# Patient Record
Sex: Female | Born: 1980 | Race: White | Hispanic: No | Marital: Single | Smoking: Never smoker
Health system: Southern US, Community
[De-identification: ages and names within clinical notes are randomized; demographics above are authoritative.]

---

## 2015-04-27 ENCOUNTER — Emergency Department
Admission: EM | Admit: 2015-04-27 | Discharge: 2015-04-27 | Disposition: A | Discharge status: Home / Self Care | Payer: Self-pay | Attending: Emergency Medicine | Admitting: Emergency Medicine

## 2015-04-27 ENCOUNTER — Encounter: Payer: Self-pay | Admitting: Emergency Medicine

## 2015-04-27 DIAGNOSIS — Y999 Unspecified external cause status: Secondary | ICD-10-CM | POA: Insufficient documentation

## 2015-04-27 DIAGNOSIS — Y939 Activity, unspecified: Secondary | ICD-10-CM | POA: Insufficient documentation

## 2015-04-27 DIAGNOSIS — Y92410 Unspecified street and highway as the place of occurrence of the external cause: Secondary | ICD-10-CM | POA: Insufficient documentation

## 2015-04-27 DIAGNOSIS — S161XXA Strain of muscle, fascia and tendon at neck level, initial encounter: Secondary | ICD-10-CM

## 2015-04-27 DIAGNOSIS — S39012A Strain of muscle, fascia and tendon of lower back, initial encounter: Secondary | ICD-10-CM

## 2015-04-27 MED ORDER — NAPROXEN 500 MG PO TBEC: 500.0000 mg | DELAYED_RELEASE_TABLET | Freq: Two times a day (BID) | ORAL | Status: AC

## 2015-04-27 MED ORDER — CYCLOBENZAPRINE HCL 5 MG PO TABS: 5.0000 mg | ORAL_TABLET | Freq: Three times a day (TID) | ORAL | Status: AC | PRN
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
## 2015-05-01 ENCOUNTER — Emergency Department: Payer: No Typology Code available for payment source

## 2015-05-01 ENCOUNTER — Emergency Department
Admission: EM | Admit: 2015-05-01 | Discharge: 2015-05-02 | Disposition: A | Discharge status: Home / Self Care | Payer: No Typology Code available for payment source | Attending: Emergency Medicine | Admitting: Emergency Medicine

## 2015-05-01 DIAGNOSIS — M6240 Contracture of muscle, unspecified site: Secondary | ICD-10-CM | POA: Diagnosis not present

## 2015-05-01 DIAGNOSIS — M545 Low back pain: Secondary | ICD-10-CM | POA: Diagnosis present

## 2015-05-01 DIAGNOSIS — M25519 Pain in unspecified shoulder: Secondary | ICD-10-CM | POA: Diagnosis not present

## 2015-05-01 DIAGNOSIS — S39012D Strain of muscle, fascia and tendon of lower back, subsequent encounter: Secondary | ICD-10-CM | POA: Insufficient documentation

## 2015-05-01 DIAGNOSIS — M542 Cervicalgia: Secondary | ICD-10-CM | POA: Insufficient documentation

## 2015-05-01 DIAGNOSIS — M62838 Other muscle spasm: Secondary | ICD-10-CM
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
## 2015-05-02 MED ORDER — METHOCARBAMOL 500 MG PO TABS: 500.0000 mg | ORAL_TABLET | Freq: Four times a day (QID) | ORAL | Status: AC

## 2015-05-02 MED ORDER — MELOXICAM 15 MG PO TABS: 15.0000 mg | ORAL_TABLET | Freq: Every day | ORAL | Status: AC

## 2015-05-02 NOTE — Discharge Instructions (Signed)
Muscle Strain  A muscle strain is an injury that occurs when a muscle is stretched beyond its normal length. Usually a small number of muscle fibers are torn when this happens. Muscle strain is rated in degrees. First-degree strains have the least amount of muscle fiber tearing and pain. Second-degree and third-degree strains have increasingly more tearing and pain.   Usually, recovery from muscle strain takes 1-2 weeks. Complete healing takes 5-6 weeks.   CAUSES   Muscle strain happens when a sudden, violent force placed on a muscle stretches it too far. This may occur with lifting, sports, or a fall.   RISK FACTORS  Muscle strain is especially common in athletes.   SIGNS AND SYMPTOMS  At the site of the muscle strain, there may be:   Pain.   Bruising.   Swelling.   Difficulty using the muscle due to pain or lack of normal function.  DIAGNOSIS   Your health care provider will perform a physical exam and ask about your medical history.  TREATMENT   Often, the best treatment for a muscle strain is resting, icing, and applying cold compresses to the injured area.   HOME CARE INSTRUCTIONS    Use the PRICE method of treatment to promote muscle healing during the first 2-3 days after your injury. The PRICE method involves:    Protecting the muscle from being injured again.    Restricting your activity and resting the injured body part.    Icing your injury. To do this, put ice in a plastic bag. Place a towel between your skin and the bag. Then, apply the ice and leave it on from 15-20 minutes each hour. After the third day, switch to moist heat packs.    Apply compression to the injured area with a splint or elastic bandage. Be careful not to wrap it too tightly. This may interfere with blood circulation or increase swelling.    Elevate the injured body part above the level of your heart as often as you can.   Only take over-the-counter or prescription medicines for pain, discomfort, or fever as directed by your  health care provider.   Warming up prior to exercise helps to prevent future muscle strains.  SEEK MEDICAL CARE IF:    You have increasing pain or swelling in the injured area.   You have numbness, tingling, or a significant loss of strength in the injured area.  MAKE SURE YOU:    Understand these instructions.   Will watch your condition.   Will get help right away if you are not doing well or get worse.     This information is not intended to replace advice given to you by your health care provider. Make sure you discuss any questions you have with your health care provider.     Document Released: 01/10/2005 Document Revised: 10/31/2012 Document Reviewed: 08/09/2012  Elsevier Interactive Patient Education 2016 Elsevier Inc.  Motor Vehicle Collision  It is common to have multiple bruises and sore muscles after a motor vehicle collision (MVC). These tend to feel worse for the first 24 hours. You may have the most stiffness and soreness over the first several hours. You may also feel worse when you wake up the first morning after your collision. After this point, you will usually begin to improve with each day. The speed of improvement often depends on the severity of the collision, the number of injuries, and the location and nature of these injuries.  HOME CARE INSTRUCTIONS     Put ice on the injured area.    Put ice in a plastic bag.    Place a towel between your skin and the bag.    Leave the ice on for 15-20 minutes, 3-4 times a day, or as directed by your health care provider.   Drink enough fluids to keep your urine clear or pale yellow. Do not drink alcohol.   Take a warm shower or bath once or twice a day. This will increase blood flow to sore muscles.   You may return to activities as directed by your caregiver. Be careful when lifting, as this may aggravate neck or back pain.   Only take over-the-counter or prescription medicines for pain, discomfort, or fever as directed by your caregiver. Do  not use aspirin. This may increase bruising and bleeding.  SEEK IMMEDIATE MEDICAL CARE IF:   You have numbness, tingling, or weakness in the arms or legs.   You develop severe headaches not relieved with medicine.   You have severe neck pain, especially tenderness in the middle of the back of your neck.   You have changes in bowel or bladder control.   There is increasing pain in any area of the body.   You have shortness of breath, light-headedness, dizziness, or fainting.   You have chest pain.   You feel sick to your stomach (nauseous), throw up (vomit), or sweat.   You have increasing abdominal discomfort.   There is blood in your urine, stool, or vomit.   You have pain in your shoulder (shoulder strap areas).   You feel your symptoms are getting worse.  MAKE SURE YOU:   Understand these instructions.   Will watch your condition.   Will get help right away if you are not doing well or get worse.     This information is not intended to replace advice given to you by your health care provider. Make sure you discuss any questions you have with your health care provider.     Document Released: 01/10/2005 Document Revised: 01/31/2014 Document Reviewed: 06/09/2010  Elsevier Interactive Patient Education 2016 Elsevier Inc.
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
## 2015-05-02 NOTE — ED Notes (Signed)
Pt alert and oriented X4, active, cooperative, pt in NAD. RR even and unlabored, color WNL.  Pt informed to return if any life threatening symptoms occur.   

## 2016-02-21 ENCOUNTER — Emergency Department
Admission: EM | Admit: 2016-02-21 | Discharge: 2016-02-21 | Disposition: A | Discharge status: Home / Self Care | Payer: No Typology Code available for payment source | Attending: Emergency Medicine | Admitting: Emergency Medicine

## 2016-02-21 ENCOUNTER — Emergency Department: Payer: No Typology Code available for payment source

## 2016-02-21 ENCOUNTER — Encounter: Payer: Self-pay | Admitting: Emergency Medicine

## 2016-02-21 DIAGNOSIS — Y9389 Activity, other specified: Secondary | ICD-10-CM | POA: Diagnosis not present

## 2016-02-21 DIAGNOSIS — S298XXA Other specified injuries of thorax, initial encounter: Secondary | ICD-10-CM

## 2016-02-21 DIAGNOSIS — S39012A Strain of muscle, fascia and tendon of lower back, initial encounter: Secondary | ICD-10-CM | POA: Diagnosis not present

## 2016-02-21 DIAGNOSIS — Y92410 Unspecified street and highway as the place of occurrence of the external cause: Secondary | ICD-10-CM | POA: Insufficient documentation

## 2016-02-21 DIAGNOSIS — S20211A Contusion of right front wall of thorax, initial encounter: Secondary | ICD-10-CM | POA: Diagnosis not present

## 2016-02-21 DIAGNOSIS — S3992XA Unspecified injury of lower back, initial encounter: Secondary | ICD-10-CM | POA: Diagnosis present

## 2016-02-21 DIAGNOSIS — Y999 Unspecified external cause status: Secondary | ICD-10-CM | POA: Diagnosis not present

## 2016-02-21 DIAGNOSIS — Z79899 Other long term (current) drug therapy: Secondary | ICD-10-CM | POA: Diagnosis not present

## 2016-02-21 LAB — POCT PREGNANCY, URINE: Preg Test, Ur: NEGATIVE

## 2016-02-21 MED ORDER — ORPHENADRINE CITRATE 30 MG/ML IJ SOLN
60.0000 mg | Freq: Two times a day (BID) | INTRAMUSCULAR | Status: DC
  Administered 2016-02-21: 60 mg via INTRAMUSCULAR
  Filled 2016-02-21: qty 2

## 2016-02-21 MED ORDER — KETOROLAC TROMETHAMINE 60 MG/2ML IM SOLN
60.0000 mg | Freq: Once | INTRAMUSCULAR | Status: AC
  Administered 2016-02-21: 60 mg via INTRAMUSCULAR
  Filled 2016-02-21: qty 2

## 2016-02-21 MED ORDER — TRAMADOL HCL 50 MG PO TABS: 50.0000 mg | ORAL_TABLET | Freq: Four times a day (QID) | ORAL | 0 refills | Status: AC | PRN

## 2016-02-21 MED ORDER — CYCLOBENZAPRINE HCL 10 MG PO TABS: 10.0000 mg | ORAL_TABLET | Freq: Three times a day (TID) | ORAL | 0 refills | Status: AC | PRN
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
## 2016-04-11 ENCOUNTER — Emergency Department
Admission: EM | Admit: 2016-04-11 | Discharge: 2016-04-11 | Disposition: A | Discharge status: Home / Self Care | Payer: Self-pay | Attending: Emergency Medicine | Admitting: Emergency Medicine

## 2016-04-11 DIAGNOSIS — L245 Irritant contact dermatitis due to other chemical products: Secondary | ICD-10-CM | POA: Insufficient documentation

## 2016-04-11 MED ORDER — PREDNISONE 10 MG (21) PO TBPK: ORAL_TABLET | Freq: Every day | ORAL | 0 refills | Status: AC

## 2016-04-11 NOTE — ED Notes (Signed)
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  This has happened in the past 2 weeks as well.  Pt reports that she has been doing this job for 1 year and has never experienced this prior to the past 2 weeks.

## 2016-04-11 NOTE — ED Notes (Signed)
Pt discharged to home.  Family member driving.  Discharge instructions reviewed.  Verbalized understanding.  No questions or concerns at this time.  Teach back verified.  Pt in NAD.  No items left in ED.   

## 2016-04-11 NOTE — ED Triage Notes (Signed)
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 Noted hands started getting dry, red patches noted around eyes.

## 2016-04-12 NOTE — ED Provider Notes (Signed)
[facility] Emergency Department Provider Note  ____________________________________________  Time seen: Approximately 11:42 PM  I have reviewed the triage vital signs and the nursing notes.   HISTORY  Chief Complaint Rash    HPI Carol Hill is a 36 y.o. female presenting to the emergency department with a rash of the skin overlying the left eye that has occurred since patient started using a respiratory mask at work. Patient states that she has been working in an extremely dry environment and has contact with numerous chemicals. Patient states that rash has occurred intermittently for the past year but has acutely worsened over the past 2 weeks. Patient has tried topical hydrocortisone and Vaseline but no other alleviating measures.   No past medical history on file.  There are no active problems to display for this patient.   No past surgical history on file.  Prior to Admission medications   Medication Sig Start Date End Date Taking? Authorizing Provider  cyclobenzaprine (FLEXERIL) 10 MG tablet Take 1 tablet (10 mg total) by mouth 3 (three) times daily as needed. 02/21/16   Joni Reining, PA-C  cyclobenzaprine (FLEXERIL) 5 MG tablet Take 1 tablet (5 mg total) by mouth every 8 (eight) hours as needed for muscle spasms. 04/27/15   Jenise V Bacon Menshew, PA-C  meloxicam (MOBIC) 15 MG tablet Take 1 tablet (15 mg total) by mouth daily. 05/02/15   Delorise Royals Cuthriell, PA-C  methocarbamol (ROBAXIN) 500 MG tablet Take 1 tablet (500 mg total) by mouth 4 (four) times daily. 05/02/15   Delorise Royals Cuthriell, PA-C  naproxen (EC NAPROSYN) 500 MG EC tablet Take 1 tablet (500 mg total) by mouth 2 (two) times daily with a meal. 04/27/15   Jenise V Bacon Menshew, PA-C  predniSONE (STERAPRED UNI-PAK 21 TAB) 10 MG (21) TBPK tablet Take by mouth daily. Take 6 tablets the first day, take 5 tablets the second day, take 4 tablets the third day, take 3 tablets the fourth day, take  2 tablets the fifth day, take 1 tablet the sixth day. 04/11/16 04/17/16  Orvil Feil, PA-C  traMADol (ULTRAM) 50 MG tablet Take 1 tablet (50 mg total) by mouth every 6 (six) hours as needed for moderate pain. 02/21/16   Joni Reining, PA-C    Allergies Patient has no known allergies.  No family history on file.  Social History Social History  Substance Use Topics  . Smoking status: Never Smoker  . Smokeless tobacco: Never Used  . Alcohol use No    Review of Systems  Constitutional: No fever/chills Eyes: No visual changes. No discharge ENT: No upper respiratory complaints. Cardiovascular: no chest pain. Respiratory: no cough. No SOB. Gastrointestinal: No abdominal pain.  No nausea, no vomiting.  No diarrhea.  No constipation. Musculoskeletal: Negative for musculoskeletal pain. Skin: Patient has rash.  Neurological: Negative for headaches, focal weakness or numbness. ____________________________________________   PHYSICAL EXAM:  VITAL SIGNS: ED Triage Vitals  Enc Vitals Group     BP 04/11/16 2104 (!) 130/111     Pulse Rate 04/11/16 2105 73     Resp 04/11/16 2104 18     Temp 04/11/16 2104 98.7 F (37.1 C)     Temp Source 04/11/16 2104 Oral     SpO2 04/11/16 2104 99 %     Weight 04/11/16 2104 135 lb (61.2 kg)     Height 04/11/16 2104 5\' 6"  (1.676 m)     Head Circumference --  Peak Flow --      Pain Score --      Pain Loc --      Pain Edu? --      Excl. in GC? --      Constitutional: Alert and oriented. Well appearing and in no acute distress. Eyes: Conjunctivae are normal. PERRL. EOMI. Head: Atraumatic. Cardiovascular: Normal rate, regular rhythm. Normal S1 and S2.  Good peripheral circulation. Respiratory: Normal respiratory effort without tachypnea or retractions. Lungs CTAB. Good air entry to the bases with no decreased or absent breath sounds. Musculoskeletal: Full range of motion to all extremities. No gross deformities appreciated. Neurologic:  Normal  speech and language. No gross focal neurologic deficits are appreciated.  Skin:  Patient has dry macular rash of the skin overlying the left eye. No surrounding cellulitis or streaking visualized. Psychiatric: Mood and affect are normal. Speech and behavior are normal. Patient exhibits appropriate insight and judgement. ____________________________________________   LABS (all labs ordered are listed, but only abnormal results are displayed)  Labs Reviewed - No data to display ____________________________________________  EKG   ____________________________________________  RADIOLOGY   No results found.  ____________________________________________    PROCEDURES  Procedure(s) performed:    Procedures    Medications - No data to display   ____________________________________________   INITIAL IMPRESSION / ASSESSMENT AND PLAN / ED COURSE  Pertinent labs & imaging results that were available during my care of the patient were reviewed by me and considered in my medical decision making (see chart for details).  Review of the [place] CSRS was performed in accordance of the [facility] prior to dispensing any controlled drugs.     Assessment and plan: Contact Dermatitis:  Patient presents to the emergency department with a dry macular rash of the skin overlying the left eye. Patient has noticed rash since she has started using a respiratory mask at work. Physical exam findings are consistent with contact dermatitis. Patient was advised to use petroleum jelly before application of respiratory mask to provide a barrier of moisture during times of excessive dryness. Patient was also discharged with tapered prednisone. Vital signs are reassuring at this time. All patient questions were answered.  ____________________________________________  FINAL CLINICAL IMPRESSION(S) / ED DIAGNOSES  Final diagnoses:  Irritant contact dermatitis due to other chemical products      NEW  MEDICATIONS STARTED DURING THIS VISIT:  Discharge Medication List as of 04/11/2016 11:05 PM    START taking these medications   Details  predniSONE (STERAPRED UNI-PAK 21 TAB) 10 MG (21) TBPK tablet Take by mouth daily. Take 6 tablets the first day, take 5 tablets the second day, take 4 tablets the third day, take 3 tablets the fourth day, take 2 tablets the fifth day, take 1 tablet the sixth day., Starting Mon 04/11/2016, Until Sun 04/17/2016, Print            This chart was dictated using voice recognition software/Dragon. Despite best efforts to proofread, errors can occur which can change the meaning. Any change was purely unintentional.    Orvil FeilJaclyn M Keyston Ardolino, PA-C 04/12/16 2348    Myrna Blazeravid Matthew Schaevitz, MD 04/15/16 [phone]

## 2016-04-25 ENCOUNTER — Encounter: Payer: Self-pay | Admitting: *Deleted

## 2016-04-25 ENCOUNTER — Emergency Department
Admission: EM | Admit: 2016-04-25 | Discharge: 2016-04-25 | Disposition: A | Discharge status: Home / Self Care | Payer: Self-pay | Attending: Emergency Medicine | Admitting: Emergency Medicine

## 2016-04-25 DIAGNOSIS — L245 Irritant contact dermatitis due to other chemical products: Secondary | ICD-10-CM | POA: Insufficient documentation

## 2016-04-25 MED ORDER — FLUOCINOLONE ACETONIDE 0.01 % EX CREA: TOPICAL_CREAM | Freq: Two times a day (BID) | CUTANEOUS | 0 refills | Status: AC

## 2016-04-25 NOTE — ED Triage Notes (Signed)
States she has burning and itching on her face and neck, states the same thing happened several weeks ago and she was given predisone and states it cleared up but not it is back, redness noted around both eyes

## 2016-04-25 NOTE — ED Provider Notes (Signed)
[facility] Emergency Department Provider Note  ____________________________________________  Time seen: Approximately 8:07 PM  I have reviewed the triage vital signs and the nursing notes.   HISTORY  Chief Complaint Rash   HPI Carol Hill is a 36 y.o. female presenting to the emergency department with an exacerbation of chemical induced contact dermatitis affecting the periorbital skin bilaterally. Patient presented to the emergency department and was seen by myself, Pia Mau PA-C, on03/19/2018. 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 At aforementioned emergency department visit, patient was given a work note and discharged with tapered prednisone. Patient states that rash overlying her eyes almost completely resolved. Once patient returned to work and prednisone was completed, rash returned. Patient denies chest pain, chest tightness, shortness of breath, nausea, vomiting and abdominal pain. Patient states that she would like to be referred to an allergist. Patient has noticed no dysphagia and is managing her own secretions.   History reviewed. No pertinent past medical history.  There are no active problems to display for this patient.   History reviewed. No pertinent surgical history.  Prior to Admission medications   Medication Sig Start Date End Date Taking? Authorizing Provider  cyclobenzaprine (FLEXERIL) 10 MG tablet Take 1 tablet (10 mg total) by mouth 3 (three) times daily as needed. 02/21/16   Joni Reining, PA-C  cyclobenzaprine (FLEXERIL) 5 MG tablet Take 1 tablet (5 mg total) by mouth every 8 (eight) hours as needed for muscle spasms. 04/27/15   Jenise V Bacon Menshew, PA-C  fluocinolone (VANOS) 0.01 % cream Apply topically 2 (two) times daily. 04/25/16   Orvil Feil, PA-C  meloxicam (MOBIC) 15 MG tablet Take 1 tablet (15 mg total) by mouth daily. 05/02/15   Delorise Royals Cuthriell, PA-C  methocarbamol  (ROBAXIN) 500 MG tablet Take 1 tablet (500 mg total) by mouth 4 (four) times daily. 05/02/15   Delorise Royals Cuthriell, PA-C  naproxen (EC NAPROSYN) 500 MG EC tablet Take 1 tablet (500 mg total) by mouth 2 (two) times daily with a meal. 04/27/15   Jenise V Bacon Menshew, PA-C  traMADol (ULTRAM) 50 MG tablet Take 1 tablet (50 mg total) by mouth every 6 (six) hours as needed for moderate pain. 02/21/16   Joni Reining, PA-C    Allergies Patient has no known allergies.  History reviewed. No pertinent family history.  Social History Social History  Substance Use Topics  . Smoking status: Never Smoker  . Smokeless tobacco: Never Used  . Alcohol use No     Review of Systems  Constitutional: No fever/chills Eyes: No visual changes. No discharge ENT: No upper respiratory complaints. Cardiovascular: no chest pain. Respiratory: no cough. No SOB. Gastrointestinal: No abdominal pain.  No nausea, no vomiting.  Musculoskeletal: Negative for musculoskeletal pain. Skin: She has rash Neurological: Negative for headaches, focal weakness or numbness.  ____________________________________________   PHYSICAL EXAM:  VITAL SIGNS: ED Triage Vitals  Enc Vitals Group     BP 04/25/16 1609 131/74     Pulse Rate 04/25/16 1609 66     Resp 04/25/16 1609 18     Temp 04/25/16 1609 98.4 F (36.9 C)     Temp Source 04/25/16 1609 Oral     SpO2 04/25/16 1609 100 %     Weight 04/25/16 1607 140 lb (63.5 kg)     Height 04/25/16 1607  (1.676 m)     Head Circumference --      Peak  Flow --      Pain Score 04/25/16 1607 3     Pain Loc --      Pain Edu? --      Excl. in GC? --     Constitutional: Alert and oriented. Well appearing and in no acute distress. Eyes: Conjunctivae are normal. PERRL. EOMI. Head: Atraumatic. Cardiovascular: Normal rate, regular rhythm. Normal S1 and S2.  Good peripheral circulation. Respiratory: Normal respiratory effort without tachypnea or retractions. Lungs CTAB. Good air  entry to the bases with no decreased or absent breath sounds. Musculoskeletal: Full range of motion to all extremities. No gross deformities appreciated. Neurologic:  Normal speech and language. No gross focal neurologic deficits are appreciated.  Skin: Patient has dry macular rash of the skin overlying the bilateral periorbital space. Psychiatric: Mood and affect are normal. Speech and behavior are normal. Patient exhibits appropriate insight and judgement. ____________________________________________   LABS (all labs ordered are listed, but only abnormal results are displayed)  Labs Reviewed - No data to display ____________________________________________  EKG   ____________________________________________  RADIOLOGY   No results found.  ____________________________________________    PROCEDURES  Procedure(s) performed:    Procedures    Medications - No data to display   ____________________________________________   INITIAL IMPRESSION / ASSESSMENT AND PLAN / ED COURSE  Pertinent labs & imaging results that were available during my care of the patient were reviewed by me and considered in my medical decision making (see chart for details).  Review of the [place] CSRS was performed in accordance of the [facility] prior to dispensing any controlled drugs.     Assessment and plan Chemical Induced Contact Dermatitis:  Patient presents to the emergency department with a dry macular rash consistent with prior diagnoses of chemical-induced contact dermatitis. Patient education was provided regarding the importance of allergen and avoidance. Patient voiced understanding. Patient and I discussed the possibility of finding other means of work in order to permanently resolve contact dermatitis. Patient has tried hydrocortisone and tapered prednisone with limited success. Patient was discharged with fluocinolone cream. A referral was made to a local allergists at patient's request.  Vital signs and physical exam are reassuring. All patient questions were answered.  ____________________________________________  FINAL CLINICAL IMPRESSION(S) / ED DIAGNOSES  Final diagnoses:  Irritant contact dermatitis due to other chemical products      NEW MEDICATIONS STARTED DURING THIS VISIT:  Discharge Medication List as of 04/25/2016  6:49 PM    START taking these medications   Details  fluocinolone (VANOS) 0.01 % cream Apply topically 2 (two) times daily., Starting Mon 04/25/2016, Print            This chart was dictated using voice recognition software/Dragon. Despite best efforts to proofread, errors can occur which can change the meaning. Any change was purely unintentional.    Orvil Feil, PA-C 04/25/16 2307    Sharyn Creamer, MD 04/25/16 2350

## 2016-04-25 NOTE — ED Notes (Signed)
See triage note   States she was seen for same in mid march  Placed on prednisone taper which she finished on 3/25  States sx's had gotten better  But returned again this weekend   Redness and swelling noted around both eyes

## 2017-01-06 ENCOUNTER — Emergency Department
Admission: EM | Admit: 2017-01-06 | Discharge: 2017-01-06 | Disposition: A | Discharge status: Home / Self Care | Payer: Self-pay | Attending: Emergency Medicine | Admitting: Emergency Medicine

## 2017-01-06 ENCOUNTER — Other Ambulatory Visit: Payer: Self-pay

## 2017-01-06 DIAGNOSIS — B373 Candidiasis of vulva and vagina: Secondary | ICD-10-CM | POA: Insufficient documentation

## 2017-01-06 DIAGNOSIS — N760 Acute vaginitis: Secondary | ICD-10-CM | POA: Insufficient documentation

## 2017-01-06 DIAGNOSIS — B9689 Other specified bacterial agents as the cause of diseases classified elsewhere: Secondary | ICD-10-CM

## 2017-01-06 DIAGNOSIS — Z791 Long term (current) use of non-steroidal anti-inflammatories (NSAID): Secondary | ICD-10-CM | POA: Insufficient documentation

## 2017-01-06 DIAGNOSIS — Z79899 Other long term (current) drug therapy: Secondary | ICD-10-CM | POA: Insufficient documentation

## 2017-01-06 DIAGNOSIS — B3731 Acute candidiasis of vulva and vagina: Secondary | ICD-10-CM

## 2017-01-06 LAB — POCT PREGNANCY, URINE: PREG TEST UR: NEGATIVE

## 2017-01-06 LAB — URINALYSIS, ROUTINE W REFLEX MICROSCOPIC
BILIRUBIN URINE: NEGATIVE
Glucose, UA: NEGATIVE mg/dL
Hgb urine dipstick: NEGATIVE
KETONES UR: NEGATIVE mg/dL
NITRITE: NEGATIVE
PROTEIN: NEGATIVE mg/dL
Specific Gravity, Urine: 1.004 — ABNORMAL LOW (ref 1.005–1.030)
pH: 5 (ref 5.0–8.0)

## 2017-01-06 LAB — WET PREP, GENITAL
Sperm: NONE SEEN
Trich, Wet Prep: NONE SEEN

## 2017-01-06 LAB — CHLAMYDIA/NGC RT PCR (ARMC ONLY)
Chlamydia Tr: NOT DETECTED
N GONORRHOEAE: NOT DETECTED

## 2017-01-06 MED ORDER — FLUCONAZOLE 50 MG PO TABS
150.0000 mg | ORAL_TABLET | Freq: Once | ORAL | Status: AC
  Administered 2017-01-06: 150 mg via ORAL
  Filled 2017-01-06: qty 3

## 2017-01-06 MED ORDER — METRONIDAZOLE 500 MG PO TABS: 500.0000 mg | ORAL_TABLET | Freq: Two times a day (BID) | ORAL | 0 refills | Status: AC

## 2017-01-06 MED ORDER — NYSTATIN 100000 UNIT/GM EX POWD: Freq: Four times a day (QID) | CUTANEOUS | 0 refills | Status: AC

## 2017-01-06 NOTE — ED Triage Notes (Signed)
Pt reports approximately one week of vaginal discharge that is clear and white. Pt reports odor to the discharge. Pt also reports she developed a rash but it seems to be going away.

## 2017-01-06 NOTE — ED Provider Notes (Signed)
[facility] Emergency Department Provider Note  Time seen: 9:22 PM  I have reviewed the triage vital signs and the nursing notes.   HISTORY  Chief Complaint Vaginal Discharge    HPI Carol Hill is a 36 y.o. female with no significant past medical history presents to the emergency department for vaginal discharge.  According to the patient for the past 1 week she has noticed foul-smelling white vaginal discharge.  Patient is here with her husband, denies significant STD risk.  Denies fever, states she has been somewhat nauseated and several days ago had an episode of vomiting.  Denies diarrhea.  Denies dysuria.   No past medical history on file.  There are no active problems to display for this patient.   No past surgical history on file.  Prior to Admission medications   Medication Sig Start Date End Date Taking? Authorizing Provider  cyclobenzaprine (FLEXERIL) 10 MG tablet Take 1 tablet (10 mg total) by mouth 3 (three) times daily as needed. 02/21/16   Joni ReiningSmith, Ronald K, PA-C  cyclobenzaprine (FLEXERIL) 5 MG tablet Take 1 tablet (5 mg total) by mouth every 8 (eight) hours as needed for muscle spasms. 04/27/15   Menshew, Charlesetta IvoryJenise V Bacon, PA-C  fluocinolone (VANOS) 0.01 % cream Apply topically 2 (two) times daily. 04/25/16   Orvil FeilWoods, Jaclyn M, PA-C  meloxicam (MOBIC) 15 MG tablet Take 1 tablet (15 mg total) by mouth daily. 05/02/15   Cuthriell, Delorise RoyalsJonathan D, PA-C  methocarbamol (ROBAXIN) 500 MG tablet Take 1 tablet (500 mg total) by mouth 4 (four) times daily. 05/02/15   Cuthriell, Delorise RoyalsJonathan D, PA-C  naproxen (EC NAPROSYN) 500 MG EC tablet Take 1 tablet (500 mg total) by mouth 2 (two) times daily with a meal. 04/27/15   Menshew, Charlesetta IvoryJenise V Bacon, PA-C  traMADol (ULTRAM) 50 MG tablet Take 1 tablet (50 mg total) by mouth every 6 (six) hours as needed for moderate pain. 02/21/16   Joni ReiningSmith, Ronald K, PA-C    No Known Allergies  No family history on file.  Social  History Social History   Tobacco Use  . Smoking status: Never Smoker  . Smokeless tobacco: Never Used  Substance Use Topics  . Alcohol use: No  . Drug use: No    Review of Systems Constitutional: Negative for fever Cardiovascular: Negative for chest pain. Respiratory: Negative for shortness of breath. Gastrointestinal: Mild lower abdominal discomfort.  Occasional nausea.  No diarrhea. Genitourinary: Negative for dysuria.  Positive for vaginal discharge times 1 week. Neurological: Negative for headache All other ROS negative  ____________________________________________   PHYSICAL EXAM:  VITAL SIGNS: ED Triage Vitals  Enc Vitals Group     BP --      Pulse --      Resp --      Temp --      Temp src --      SpO2 --      Weight 01/06/17 2040 138 lb (62.6 kg)     Height 01/06/17 2040 5' 6.5" (1.689 m)     Head Circumference --      Peak Flow --      Pain Score 01/06/17 2039 6     Pain Loc --      Pain Edu? --      Excl. in GC? --    Constitutional: Alert and oriented. Well appearing and in no distress. Eyes: Normal exam ENT   Head: Normocephalic and atraumatic   Mouth/Throat: Mucous membranes are moist. Cardiovascular:  Normal rate, regular rhythm. No murmur Respiratory: Normal respiratory effort without tachypnea nor retractions. Breath sounds are clear Gastrointestinal: Soft, slight suprapubic tenderness to palpation.  No rebound or guarding.  No distention. Musculoskeletal: Nontender with normal range of motion in all extremities.  Neurologic:  Normal speech and language. No gross focal neurologic deficits Skin:  Skin is warm, dry and intact.   ____________________________________________   INITIAL IMPRESSION / ASSESSMENT AND PLAN / ED COURSE  Pertinent labs & imaging results that were available during my care of the patient were reviewed by me and considered in my medical decision making (see chart for details).  Patient presents to the emergency  department for vaginal discharge times 7 days.  Differential would include bacterial vaginitis, trichomoniasis, less likely STD, candidal infection.  Will perform a pelvic examination, check urinalysis and pregnancy test. Urinalysis shows white blood cells but no bacteria, pregnancy test negative.  Wet prep pending.  Wet prep positive for yeast infection as well as bacterial vaginosis.  We will dose Diflucan, discharged with a nystatin cream for external use only as the patient does have some erythema and redness extending posteriorly into her buttocks.  We will also discharged with Flagyl.  Patient agreeable this plan of care.  ____________________________________________   FINAL CLINICAL IMPRESSION(S) / ED DIAGNOSES  Vaginal discharge Yeast infection Bacterial vaginosis.    Minna AntisPaduchowski, Kenna Kirn, MD 01/06/17 2205
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
## 2020-06-26 ENCOUNTER — Other Ambulatory Visit: Payer: Self-pay

## 2020-06-26 ENCOUNTER — Emergency Department
Admission: EM | Admit: 2020-06-26 | Discharge: 2020-06-26 | Disposition: A | Discharge status: Home / Self Care | Payer: Self-pay | Attending: Emergency Medicine | Admitting: Emergency Medicine

## 2020-06-26 ENCOUNTER — Encounter: Payer: Self-pay | Admitting: Emergency Medicine

## 2020-06-26 DIAGNOSIS — Z23 Encounter for immunization: Secondary | ICD-10-CM | POA: Insufficient documentation

## 2020-06-26 DIAGNOSIS — W540XXA Bitten by dog, initial encounter: Secondary | ICD-10-CM | POA: Insufficient documentation

## 2020-06-26 DIAGNOSIS — Z203 Contact with and (suspected) exposure to rabies: Secondary | ICD-10-CM | POA: Insufficient documentation

## 2020-06-26 DIAGNOSIS — S91052A Open bite, left ankle, initial encounter: Secondary | ICD-10-CM | POA: Insufficient documentation

## 2020-06-26 MED ORDER — TETANUS-DIPHTH-ACELL PERTUSSIS 5-2.5-18.5 LF-MCG/0.5 IM SUSY
0.5000 mL | PREFILLED_SYRINGE | Freq: Once | INTRAMUSCULAR | Status: AC
  Administered 2020-06-26: 0.5 mL via INTRAMUSCULAR
  Filled 2020-06-26: qty 0.5

## 2020-06-26 MED ORDER — AMOXICILLIN-POT CLAVULANATE 875-125 MG PO TABS: 1.0000 | ORAL_TABLET | Freq: Two times a day (BID) | ORAL | 0 refills | Status: AC

## 2020-06-26 NOTE — Discharge Instructions (Signed)
Take Augmentin twice daily for ten days.  

## 2020-06-26 NOTE — ED Provider Notes (Signed)
[facility]-EMERGENCY DEPARTMENT  ____________________________________________  Time seen: Approximately 8:33 PM  I have reviewed the triage vital signs and the nursing notes.   HISTORY  Chief Complaint Animal Bite   Historian Patient     HPI Carol Hill is a 40 y.o. female presents to the emergency department after being bitten by a dog 7 days ago.  Dog is domesticated and is available to be quarantined for signs and symptoms of rabies.  Incident has been reported to animal control although patient has not heard back from animal control regarding quarantine status for the animal and does have concerns about that.  Patient has had no erythema surrounding the puncture wound sites.  She has been ambulatory.  There is been no expression of purulence from puncture sites.  She cannot recall her last tetanus shot.  No other alleviating measures attempted.   History reviewed. No pertinent past medical history.   Immunizations up to date:  Yes.     History reviewed. No pertinent past medical history.  There are no problems to display for this patient.   History reviewed. No pertinent surgical history.  Prior to Admission medications   Medication Sig Start Date End Date Taking? Authorizing Provider  amoxicillin-clavulanate (AUGMENTIN) 875-125 MG tablet Take 1 tablet by mouth 2 (two) times daily for 10 days. 06/26/20 07/06/20 Yes Pia Mau M, PA-C  cyclobenzaprine (FLEXERIL) 10 MG tablet Take 1 tablet (10 mg total) by mouth 3 (three) times daily as needed. 02/21/16   Joni Reining, PA-C  cyclobenzaprine (FLEXERIL) 5 MG tablet Take 1 tablet (5 mg total) by mouth every 8 (eight) hours as needed for muscle spasms. 04/27/15   Menshew, Charlesetta Ivory, PA-C  fluocinolone (VANOS) 0.01 % cream Apply topically 2 (two) times daily. 04/25/16   Orvil Feil, PA-C  meloxicam (MOBIC) 15 MG tablet Take 1 tablet (15 mg total) by mouth daily. 05/02/15   Cuthriell, Delorise Royals, PA-C  methocarbamol  (ROBAXIN) 500 MG tablet Take 1 tablet (500 mg total) by mouth 4 (four) times daily. 05/02/15   Cuthriell, Delorise Royals, PA-C  naproxen (EC NAPROSYN) 500 MG EC tablet Take 1 tablet (500 mg total) by mouth 2 (two) times daily with a meal. 04/27/15   Menshew, Charlesetta Ivory, PA-C  nystatin (MYCOSTATIN/NYSTOP) powder Apply topically 4 (four) times daily. 01/06/17   Minna Antis, MD  traMADol (ULTRAM) 50 MG tablet Take 1 tablet (50 mg total) by mouth every 6 (six) hours as needed for moderate pain. 02/21/16   Joni Reining, PA-C    Allergies Patient has no known allergies.  No family history on file.  Social History Social History   Tobacco Use  . Smoking status: Never Smoker  . Smokeless tobacco: Never Used  Substance Use Topics  . Alcohol use: No  . Drug use: No     Review of Systems  Constitutional: No fever/chills Eyes:  No discharge ENT: No upper respiratory complaints. Respiratory: no cough. No SOB/ use of accessory muscles to breath Gastrointestinal:   No nausea, no vomiting.  No diarrhea.  No constipation. Musculoskeletal: Negative for musculoskeletal pain. Skin: Patient has dog bite wound.     ____________________________________________   PHYSICAL EXAM:  VITAL SIGNS: ED Triage Vitals  Enc Vitals Group     BP 06/26/20 1808 (!) 147/72     Pulse Rate 06/26/20 1808 87     Resp 06/26/20 1808 18     Temp 06/26/20 1812 99 F (37.2 C)  Temp Source 06/26/20 1812 Oral     SpO2 06/26/20 1808 98 %     Weight 06/26/20 1804 145 lb (65.8 kg)     Height 06/26/20 1804 5\' 6"  (1.676 m)     Head Circumference --      Peak Flow --      Pain Score 06/26/20 1804 5     Pain Loc --      Pain Edu? --      Excl. in GC? --      Constitutional: Alert and oriented. Well appearing and in no acute distress. Eyes: Conjunctivae are normal. PERRL. EOMI. Head: Atraumatic. ENT:      Ears:       Nose: No congestion/rhinnorhea.      Mouth/Throat: Mucous membranes are moist.   Neck: No stridor.  No cervical spine tenderness to palpation. Cardiovascular: Normal rate, regular rhythm. Normal S1 and S2.  Good peripheral circulation. Respiratory: Normal respiratory effort without tachypnea or retractions. Lungs CTAB. Good air entry to the bases with no decreased or absent breath sounds Gastrointestinal: Bowel sounds x 4 quadrants. Soft and nontender to palpation. No guarding or rigidity. No distention. Musculoskeletal: Full range of motion to all extremities. No obvious deformities noted. Palpable dorsalis pedis pulse bilaterally and symmetrically.  Neurologic:  Normal for age. No gross focal neurologic deficits are appreciated.  Skin: Patient has dog bite wound along posterior aspect of left ankle.  Dog bite wound appears superficial with no surrounding cellulitis.  No expression of purulence. Psychiatric: Mood and affect are normal for age. Speech and behavior are normal.   ____________________________________________   LABS (all labs ordered are listed, but only abnormal results are displayed)  Labs Reviewed - No data to display ____________________________________________  EKG   ____________________________________________  RADIOLOGY   No results found.  ____________________________________________    PROCEDURES  Procedure(s) performed:     Procedures     Medications - No data to display   ____________________________________________   INITIAL IMPRESSION / ASSESSMENT AND PLAN / ED COURSE  Pertinent labs & imaging results that were available during my care of the patient were reviewed by me and considered in my medical decision making (see chart for details).      Assessment and Plan:  Dog bite 40 year old female presents to the emergency department after she was bitten by a dog 1 week ago.  Patient was mildly hypertensive at triage but vital signs were otherwise reassuring.  On physical exam, patient skin appeared well-healed.   Wounds appeared superficial in nature with no expression of purulence or surrounding cellulitis.  Patient demonstrated full range of motion of the left ankle.  I had extensive conversation with patient about the rabies vaccine.  Patient elected to wait to initiate rabies vaccine series as animal is available to be quarantined for signs and symptoms of rabies.  Patient was started on Augmentin twice daily for 10 days and her tetanus status was updated prior to discharge.  All patient questions were answered.    ____________________________________________  FINAL CLINICAL IMPRESSION(S) / ED DIAGNOSES  Final diagnoses:  Dog bite, initial encounter      NEW MEDICATIONS STARTED DURING THIS VISIT:  ED Discharge Orders         Ordered    amoxicillin-clavulanate (AUGMENTIN) 875-125 MG tablet  2 times daily        06/26/20 2026              This chart was dictated using voice recognition software/Dragon. Despite  best efforts to proofread, errors can occur which can change the meaning. Any change was purely unintentional.     Gasper Lloyd 06/26/20 2043    Phineas Semen, MD 06/26/20 [phone]

## 2020-06-26 NOTE — ED Triage Notes (Signed)
Pt reports was bit by a dog Saturday. Pt states she was told she may need abx. Pt has not yet heard about the rabies status of the dog.

## 2020-06-26 NOTE — ED Notes (Signed)
Patient reports incident was reported to [facility]. Patient reports unknown if the dog has had rabies vaccines. Patient reports dog bite occurred Saturday. 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 Patient reports "wound is healed" to L ankle. Patient is noted to have mild swelling to lateral left ankle.

## 2020-06-26 NOTE — ED Notes (Signed)
Left ankle and lower leg appears swollen.

## 2020-06-26 NOTE — ED Notes (Signed)
Patient denies pain, swelling at the injection site or redness. Patient discharged to POV.
